# Patient Record
Sex: Female | Born: 1990 | Race: White | Hispanic: Yes | Marital: Married | Smoking: Never smoker
Health system: Southern US, Community
[De-identification: ages and names within clinical notes are randomized; demographics above are authoritative.]

## PROBLEM LIST (undated history)

## (undated) DIAGNOSIS — R51 Headache: Secondary | ICD-10-CM

## (undated) DIAGNOSIS — B999 Unspecified infectious disease: Secondary | ICD-10-CM

## (undated) HISTORY — DX: Headache: R51

## (undated) HISTORY — DX: Unspecified infectious disease: B99.9

---
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
## 2012-03-06 DIAGNOSIS — B999 Unspecified infectious disease: Secondary | ICD-10-CM

## 2012-03-06 HISTORY — DX: Unspecified infectious disease: B99.9

## 2012-04-27 ENCOUNTER — Ambulatory Visit (INDEPENDENT_AMBULATORY_CARE_PROVIDER_SITE_OTHER): Payer: Medicaid Other

## 2012-04-27 ENCOUNTER — Ambulatory Visit: Payer: Medicaid Other | Admitting: Obstetrics and Gynecology

## 2012-04-27 ENCOUNTER — Other Ambulatory Visit: Payer: Self-pay | Admitting: Obstetrics and Gynecology

## 2012-04-27 ENCOUNTER — Encounter: Payer: Self-pay | Admitting: Obstetrics and Gynecology

## 2012-04-27 ENCOUNTER — Ambulatory Visit (INDEPENDENT_AMBULATORY_CARE_PROVIDER_SITE_OTHER): Payer: Medicaid Other | Admitting: Obstetrics and Gynecology

## 2012-04-27 VITALS — BP 102/70 | Wt 148.0 lb

## 2012-04-27 DIAGNOSIS — O26849 Uterine size-date discrepancy, unspecified trimester: Secondary | ICD-10-CM

## 2012-04-27 DIAGNOSIS — O4690 Antepartum hemorrhage, unspecified, unspecified trimester: Secondary | ICD-10-CM

## 2012-04-27 DIAGNOSIS — O209 Hemorrhage in early pregnancy, unspecified: Secondary | ICD-10-CM

## 2012-04-27 DIAGNOSIS — O039 Complete or unspecified spontaneous abortion without complication: Secondary | ICD-10-CM

## 2012-04-27 DIAGNOSIS — Z331 Pregnant state, incidental: Secondary | ICD-10-CM

## 2012-04-27 LAB — US OB TRANSVAGINAL

## 2012-04-27 LAB — POCT URINALYSIS DIPSTICK
Blood, UA: 250
Leukocytes, UA: NEGATIVE
Nitrite, UA: NEGATIVE
pH, UA: 8.5

## 2012-04-27 LAB — HCG, QUANTITATIVE, PREGNANCY: hCG, Beta Chain, Quant, S: <2 m[IU]/mL

## 2012-04-27 NOTE — Progress Notes (Signed)
Pt is having 1st trimester bleeding.

## 2012-04-27 NOTE — Progress Notes (Signed)
  21 YO seen at [facility] 03/25/12 with a positive pregnancy test even though she was spotting (and did so x 5 days)  and was seen by them for the same along with treatment for a urinary tract infection.  States that she had blood pregnancy test that "said she was" 2-[redacted] weeks pregnant  with a number value of approx. 250.  She spotted a week later and had a repeat test that she thinks was 500. She denies any cramping.  Had no further spotting until 04/26/12 but was much lighter.  This morning the  spotting is more red but no heavier.  Blood Type  O+.  Denies any bowel or bladder symptoms, vaginitis symptoms, dyspareunia or other pelvic pain.  Ultrasound: 5.91 x 5.18 x 3.37 cm with no evidence of an IUP; ovaries appear normal except for a simple left ovarian cyst-5.0 x 3.1 x 4.7 cm; no free fluid, no tenderness on exam and no sonographic evidence of ectopic   Abdomen: soft, non-tender      Pelvic: EGBUS-wnl, vagina-moderate brown discharge, cervix-no lesions or tenderness,      Uterus-retroflexed, non-tender, appears normal size, adnexae-no tenderness or masses   UPT: negative  A: Early pregnancy loss vs chemical pregnancy     Simple asymptomatic left ovarian cyst       P: QHCG & ABO-Rh pending       GC/CT-pending       Reviewed causes of pregnancy loss       RTO-as scheduled or prn  Asaad Gulley, PA-C

## 2012-04-27 NOTE — Progress Notes (Unsigned)
NOB interview.Pt states  Was seen at [facility] [address] for +UPT.  Had spotting 03/25/12 and had lab done at that time and was told HCG was OK. Was also treated at that time for UTI. No sx currently. Had spotting again 04/23/12 and 04/26/12. Is reddish to brown.  No pain or cramping. Last IC 04/24/12.  For evaL today with EP.

## 2012-04-28 ENCOUNTER — Telehealth: Payer: Self-pay | Admitting: Obstetrics and Gynecology

## 2012-04-28 LAB — PRENATAL PANEL VII
Basophils Absolute: 0 10*3/uL (ref 0.0–0.1)
Eosinophils Relative: 1 % (ref 0–5)
HIV: NONREACTIVE
Hepatitis B Surface Ag: NEGATIVE
Lymphocytes Relative: 33 % (ref 12–46)
Lymphs Abs: 2.4 10*3/uL (ref 0.7–4.0)
MCV: 86.7 fL (ref 78.0–100.0)
Neutro Abs: 4.2 10*3/uL (ref 1.7–7.7)
Neutrophils Relative %: 58 % (ref 43–77)
Platelets: 257 10*3/uL (ref 150–400)
RBC: 4.82 MIL/uL (ref 3.87–5.11)
Rubella: 126.9 IU/mL — ABNORMAL HIGH
WBC: 7.3 10*3/uL (ref 4.0–10.5)

## 2012-04-28 LAB — GC/CHLAMYDIA PROBE AMP
CT Probe RNA: NEGATIVE
GC Probe RNA: NEGATIVE

## 2012-04-29 ENCOUNTER — Telehealth: Payer: Self-pay

## 2012-04-29 ENCOUNTER — Telehealth: Payer: Self-pay | Admitting: Obstetrics and Gynecology

## 2012-04-29 LAB — HEMOGLOBINOPATHY EVALUATION
Hemoglobin Other: 0 %
Hgb F Quant: 0 % (ref 0.0–2.0)
Hgb S Quant: 0 %

## 2012-04-29 LAB — CULTURE, OB URINE

## 2012-04-29 NOTE — Telephone Encounter (Signed)
Message copied by Winfred Leeds on Fri Apr 29, 2012  2:51 PM ------      Message from: Henreitta Leber      Created: Fri Apr 29, 2012  1:08 PM       Please advise patient that her blood pregnancy test was negative and that she may resume sex. (those results are on another report).  Thanks,  EP

## 2012-04-29 NOTE — Telephone Encounter (Signed)
Returned pt call regarding lab results. Advised pt that her hcg level was <2 which is an indication that she is not pregnant. Also advised pt that gc/ct tests were negative. Pt voiced understanding.

## 2012-04-29 NOTE — Telephone Encounter (Signed)
TC TO PT REGARDING BLOOD PREGNANCY TEST. INFORMED PT THAT BLOOD PREG. TEST WAS NEGATIVE THEREFORE SHE MAY RESUME SEX. PT VOICED UNDERSTANDING.

## 2012-05-12 ENCOUNTER — Ambulatory Visit (INDEPENDENT_AMBULATORY_CARE_PROVIDER_SITE_OTHER): Payer: Medicaid Other | Admitting: Obstetrics and Gynecology

## 2012-05-12 ENCOUNTER — Encounter: Payer: Self-pay | Admitting: Obstetrics and Gynecology

## 2012-05-12 VITALS — BP 92/60 | HR 62 | Wt 149.0 lb

## 2012-05-12 DIAGNOSIS — Z309 Encounter for contraceptive management, unspecified: Secondary | ICD-10-CM

## 2012-05-12 DIAGNOSIS — Z30017 Encounter for initial prescription of implantable subdermal contraceptive: Secondary | ICD-10-CM

## 2012-05-12 LAB — POCT URINE PREGNANCY: Preg Test, Ur: NEGATIVE

## 2012-05-12 MED ORDER — ETONOGESTREL 68 MG ~~LOC~~ IMPL
68.0000 mg | DRUG_IMPLANT | Freq: Once | SUBCUTANEOUS | Status: AC
  Administered 2012-05-12: 68 mg via SUBCUTANEOUS

## 2012-05-12 NOTE — Addendum Note (Signed)
Addended byWinfred Leeds on: 05/12/2012 04:49 PM   Modules accepted: Orders

## 2012-05-12 NOTE — Patient Instructions (Signed)
Call [facility] [phone]:  -for temperature of 100.4 degrees Fahrenheit or more -pain not improved with over the counter pain medications (Ibuprofen, Advil, Aleve,     Tylenol or acetaminophen) -for excessive bleeding from insertion site -for excessive swelling redness or green drainage from your insertion site -for any other concerns -keep insertion site clean, dry and covered  for 24 hours -you may remove pressure bandage in 1-4 hours  Use a back-up method of birth control for the next 4 weeks  

## 2012-05-12 NOTE — Progress Notes (Signed)
21 YO with interest in the Nexplanon implant, has read about it (brochure) and after discussing today the MOA, side effects, effectiveness, risks and insertion process she would like to proceed.  Denies unprotected intercourse in the past 14 days-has used condoms each time and is on period now.   O:  Nexplanon inserted per protocol in medial left arm without difficulty;  palpated by clinician and patient; dressed with sterile band-aid and 4 x 4 pressure gauze        with Kling  UPT-negative  A: Nexplanon Insertion  P; Reviewed signs and symptoms of infection and wound care      RTO-1 week for follow up or prn   Nicco Reaume, PA-C

## 2012-06-13 ENCOUNTER — Ambulatory Visit (INDEPENDENT_AMBULATORY_CARE_PROVIDER_SITE_OTHER): Payer: Medicaid Other | Admitting: Obstetrics and Gynecology

## 2012-06-13 ENCOUNTER — Encounter: Payer: Self-pay | Admitting: Obstetrics and Gynecology

## 2012-06-13 VITALS — BP 90/60 | Temp 98.5°F | Wt 149.0 lb

## 2012-06-13 DIAGNOSIS — Z309 Encounter for contraceptive management, unspecified: Secondary | ICD-10-CM

## 2012-06-13 NOTE — Progress Notes (Signed)
21 YO with recent Nexplanon insertion returns for follow up with no complaints.  O:  Left Medial Upper Arm-no evidence of infection, rod palpated by patient and clinician  A:  Nexplanon Follow up  P:  RTO-as scheduled or prn  Carolee Channell, PA-C

## 2014-05-07 ENCOUNTER — Encounter: Payer: Self-pay | Admitting: Obstetrics and Gynecology

## 2014-05-21 ENCOUNTER — Ambulatory Visit (INDEPENDENT_AMBULATORY_CARE_PROVIDER_SITE_OTHER): Payer: 59

## 2014-05-21 ENCOUNTER — Ambulatory Visit (INDEPENDENT_AMBULATORY_CARE_PROVIDER_SITE_OTHER): Payer: 59 | Admitting: Internal Medicine

## 2014-05-21 VITALS — BP 90/50 | HR 67 | Temp 98.6°F | Resp 16 | Ht 60.0 in | Wt 165.0 lb

## 2014-05-21 DIAGNOSIS — M25572 Pain in left ankle and joints of left foot: Secondary | ICD-10-CM

## 2014-05-21 NOTE — Progress Notes (Addendum)
   Subjective:  This chart was scribed for Ellamae Siaobert Azlee Monforte, MD by Haywood PaoNadim Abu Hashem, ED Scribe at [facility].The patient was seen in exam room 09 and the patient's care was started at 6:55 PM.   Patient ID: Dana Carlson, female    DOB: 1991-05-19, 23 y.o.   MRN: [id number]  HPI  HPI Comments: Dana Carlson is a 23 y.o. female who [facility] complaining of left ankle injury. Pt states on Saturday she was walking down the stairs and she stepped in a hole covered by grass and twisted her ankle. Pt states the pain has worsened today. She has been wearing a brace which has provided some relief.   Review of Systems  Musculoskeletal: Positive for joint swelling, arthralgias and gait problem.      Objective:   Physical Exam  Constitutional: She is oriented to person, place, and time. She appears well-developed and well-nourished.  HENT:  Head: Normocephalic and atraumatic.  Eyes: EOM are normal.  Neck: Normal range of motion.  Cardiovascular: Normal rate.   Pulmonary/Chest: Effort normal.  Musculoskeletal:  Left ankle is swollen laterally with bruising.  Tenderness of the distal fibula, lateral ligament complexes and the base of the 5th MT  Neurological: She is alert and oriented to person, place, and time.  Skin: Skin is warm and dry.  Psychiatric: She has a normal mood and affect. Her behavior is normal.  Nursing note and vitals reviewed. [facility] reading (PRIMARY) by  Dr. Josephina Gipoolittle=no fx ankle or 5th MT      Assessment & Plan:  I personally performed the services described in this documentation, which was scribed in my presence. The recorded information has been reviewed and is accurate.  Sprain ankle and foot-L  Swedo/ice tid 2d/exercises 3 weeks til endpoint described

## 2014-07-04 ENCOUNTER — Ambulatory Visit (INDEPENDENT_AMBULATORY_CARE_PROVIDER_SITE_OTHER): Payer: 59 | Admitting: Physician Assistant

## 2014-07-04 VITALS — BP 112/68 | HR 77 | Temp 97.9°F | Resp 18 | Ht 61.0 in | Wt 163.0 lb

## 2014-07-04 DIAGNOSIS — B9789 Other viral agents as the cause of diseases classified elsewhere: Principal | ICD-10-CM

## 2014-07-04 DIAGNOSIS — J069 Acute upper respiratory infection, unspecified: Secondary | ICD-10-CM

## 2014-07-04 MED ORDER — GUAIFENESIN ER 1200 MG PO TB12: 1.0000 | ORAL_TABLET | Freq: Two times a day (BID) | ORAL | Status: AC | PRN

## 2014-07-04 MED ORDER — HYDROCOD POLST-CHLORPHEN POLST 10-8 MG/5ML PO LQCR: 5.0000 mL | Freq: Two times a day (BID) | ORAL | Status: AC | PRN

## 2014-07-04 MED ORDER — IPRATROPIUM BROMIDE 0.03 % NA SOLN: 2.0000 | Freq: Two times a day (BID) | NASAL | Status: AC

## 2014-07-04 NOTE — Progress Notes (Signed)
Subjective:    Patient ID: Dana Carlson ParentsBrenda Schear, female    DOB: October 12, 1990, 23 y.o.   MRN: [id number]  HPI  This is a 23 year old female with no significant PMH who is presenting with congestion and cough x 5 days. The cough is dry. She is having a hard time sleeping d/t cough. She has developed some anterior chest pain with coughing. She felt warm with chills on second day of illness but this resolved. She states her throat feels itchy. She is having sinus pressure but no nasal discharge. She is having bilateral ear pressure. She has tried thermaflu, tylenol/advil and nyquil without much relief. She is generally feeling better but cough is staying the same. She denies SOB or wheezing. She does not have a history of asthma and is not a smoker.  Review of Systems  Constitutional: Positive for chills. Negative for fever.  HENT: Positive for congestion and sinus pressure. Negative for rhinorrhea.   Eyes: Negative for redness.  Respiratory: Positive for cough. Negative for shortness of breath and wheezing.   Cardiovascular: Positive for chest pain.  Gastrointestinal: Negative for abdominal pain.  Skin: Negative for rash.  Psychiatric/Behavioral: Positive for sleep disturbance.   Patient Active Problem List   Diagnosis Date Noted  . Vaginal bleeding in pregnancy 04/27/2012   Home meds: None  No Known Allergies  Patient's social and family history were reviewed.      Objective:   Physical Exam  Constitutional: She is oriented to person, place, and time. She appears well-developed and well-nourished. No distress.  HENT:  Head: Normocephalic and atraumatic.  Right Ear: Hearing, external ear and ear canal normal. Tympanic membrane is retracted.  Left Ear: Hearing, tympanic membrane, external ear and ear canal normal.  Nose: Mucosal edema present. Right sinus exhibits no frontal sinus tenderness. Left sinus exhibits no frontal sinus tenderness.  Mouth/Throat: Uvula is midline, oropharynx is  clear and moist and mucous membranes are normal. No oropharyngeal exudate, posterior oropharyngeal erythema or tonsillar abscesses.  Slight bilateral maxillary tenderness  Eyes: Conjunctivae and lids are normal. Right eye exhibits no discharge. Left eye exhibits no discharge. No scleral icterus.  Neck: Trachea normal.  Cardiovascular: Normal rate, regular rhythm, normal heart sounds, intact distal pulses and normal pulses.   No murmur heard. Pulmonary/Chest: Effort normal and breath sounds normal. No respiratory distress. She has no wheezes. She has no rhonchi. She has no rales.  Musculoskeletal: Normal range of motion.  Lymphadenopathy:       Head (right side): No submental, no submandibular, no tonsillar, no preauricular, no posterior auricular and no occipital adenopathy present.       Head (left side): No submental, no submandibular, no tonsillar, no preauricular, no posterior auricular and no occipital adenopathy present.    She has no cervical adenopathy.  Neurological: She is alert and oriented to person, place, and time.  Skin: Skin is warm, dry and intact. No lesion and no rash noted.  Psychiatric: She has a normal mood and affect. Her speech is normal and behavior is normal. Thought content normal.      Assessment & Plan:  1. Viral URI with cough Pt likely has a viral URI. Focus is on supportive care, see meds prescribed below. A sinus infection may be developing - if conservative management does not improve symptoms in 5 days, pt will message me on mychart and I will send in a prescription for abx.  - ipratropium (ATROVENT) 0.03 % nasal spray; Place 2 sprays into  both nostrils 2 (two) times daily.  Dispense: 30 mL; Refill: 0 - Guaifenesin (MUCINEX MAXIMUM STRENGTH) 1200 MG TB12; Take 1 tablet (1,200 mg total) by mouth every 12 (twelve) hours as needed.  Dispense: 14 tablet; Refill: 1 - chlorpheniramine-HYDROcodone (TUSSIONEX PENNKINETIC ER) 10-8 MG/5ML LQCR; Take 5 mLs by mouth  every 12 (twelve) hours as needed for cough (cough).  Dispense: 80 mL; Refill: 0   Eliga Arvie V. Dyke BrackettBush, PA-C, MHS [facility]  07/04/2014

## 2014-07-04 NOTE — Patient Instructions (Signed)
Use nasal spray and mucinex twice a day. Use syrup at night for cough suppression and to help you sleep. If you are not getting better in 5 days, send me a message on mychart and I will send in an antibiotic for you.

## 2014-07-31 ENCOUNTER — Telehealth: Payer: Self-pay

## 2014-07-31 NOTE — Telephone Encounter (Signed)
Pt was given an oow note for work and her boss have misplaced it. Would like to know if she can stop by and pick up another one Please call [phone] when ready

## 2014-07-31 NOTE — Telephone Encounter (Signed)
LMOM for pt to call back to verify which note she needs.

## 2014-07-31 NOTE — Telephone Encounter (Signed)
Printed OOW note given in December. Advised pt via VM- return call if need anything further.

## 2015-08-24 IMAGING — CR DG ANKLE COMPLETE 3+V*L*
2 series · 2 of 2 positions shown · non-contrast
Comparison: None

CLINICAL DATA: LEFT ankle pain, swelling and bruising, tenderness
at distal fibula, lateral ligament complex and base of fifth
metatarsal

EXAM:
LEFT ANKLE COMPLETE - 2 VIEW

[AP]
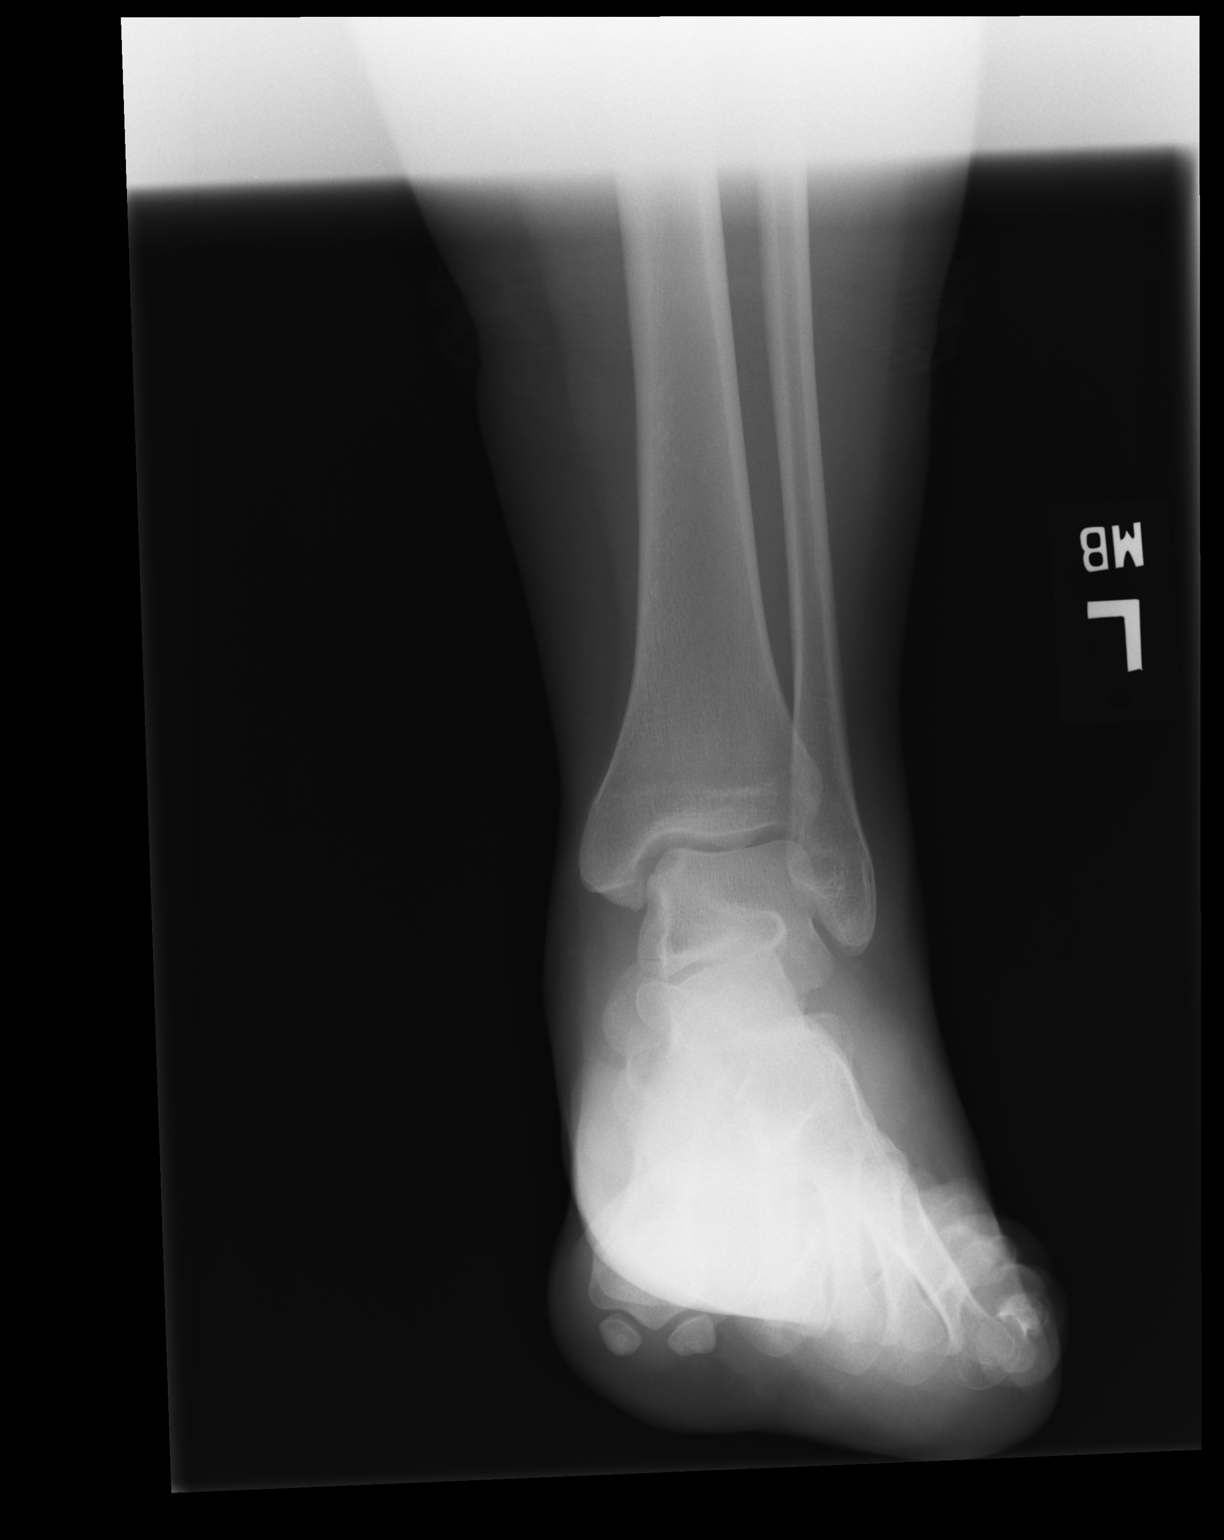

[ap obl int rot]
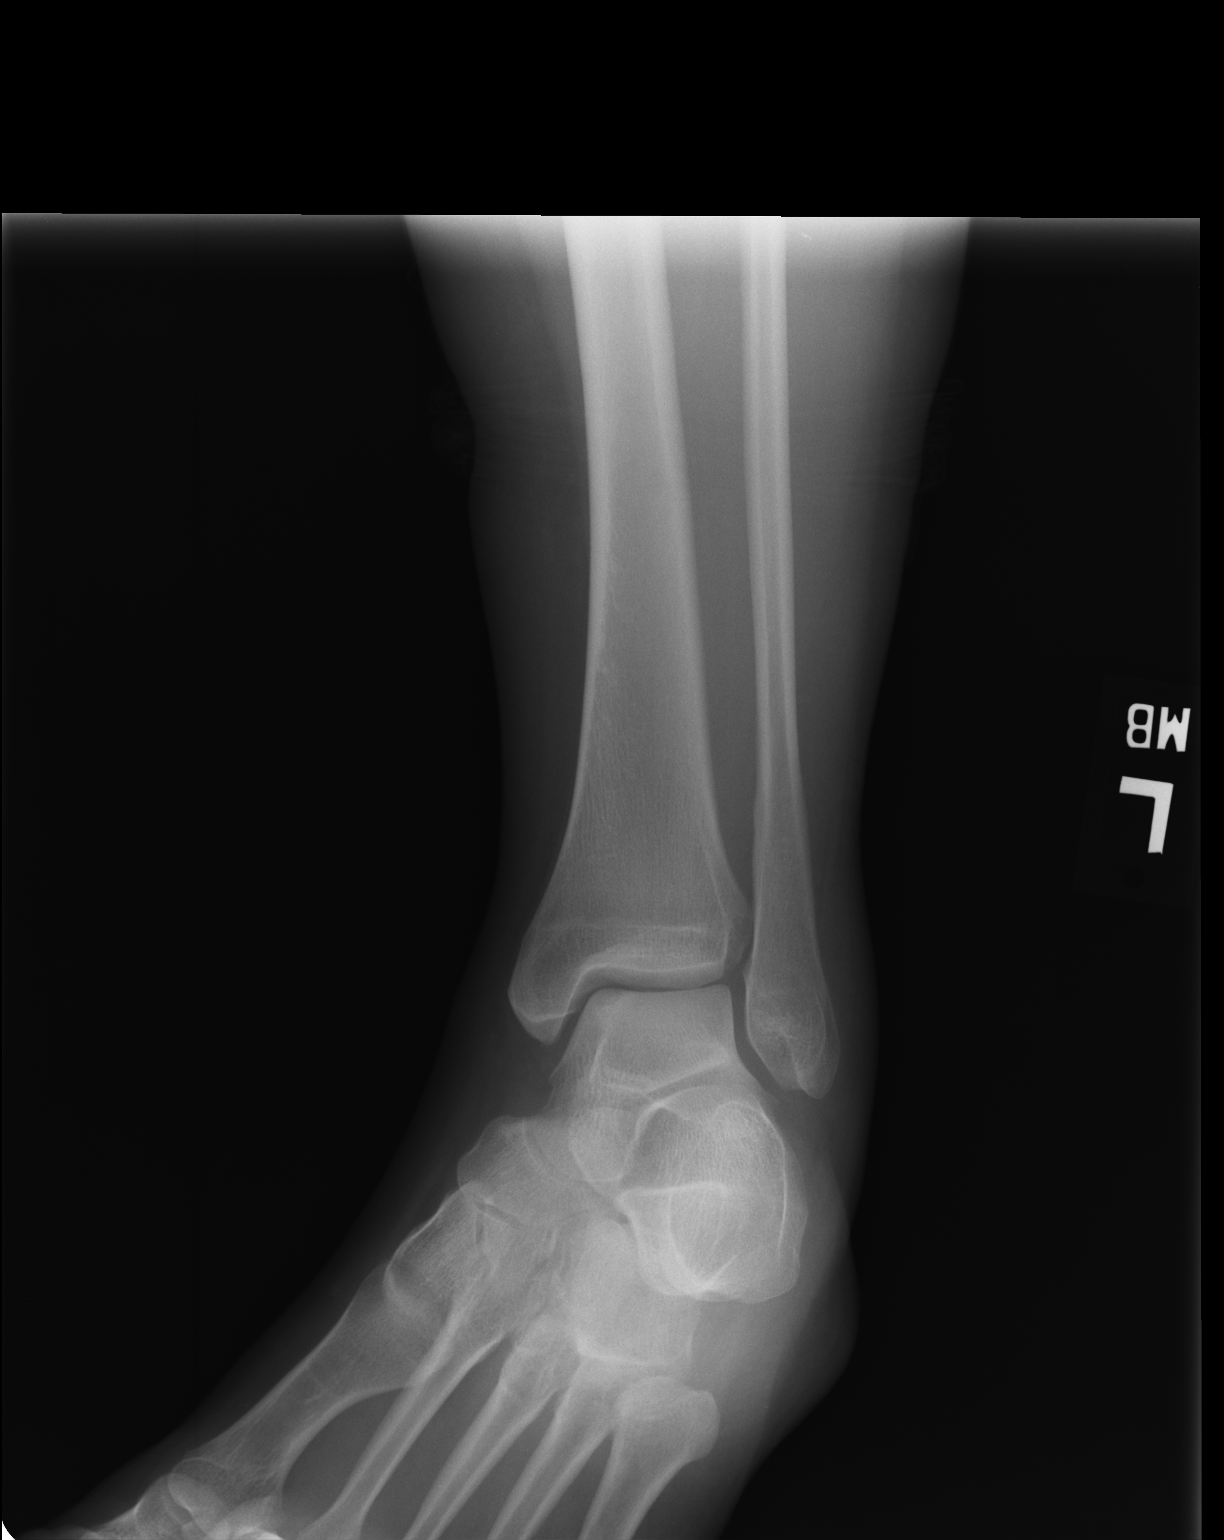

[2 of 2 positions shown; findings below may reference images not displayed]

FINDINGS: AP and oblique views; no lateral view submitted.

Correlation is made with the lateral RIGHT foot radiograph of
05/21/2014.

Soft tissue swelling greatest laterally.

Osseous mineralization normal.

Joint spaces preserved.

Osteochondral defect identified at medial aspect of talar dome
question osteochondritis dissecans.

No additional fracture, dislocation or bone destruction.
IMPRESSION: Osteochondritis dissecans involving the medial aspect of the talar
dome.

No additional acute osseous abnormality identified.
